# Patient Record
Sex: Female | Born: 1965 | Race: White | Hispanic: No | Marital: Married | Smoking: Current every day smoker
Health system: Southern US, Community
[De-identification: ages and names within clinical notes are randomized; demographics above are authoritative.]

## PROBLEM LIST (undated history)

## (undated) DIAGNOSIS — E039 Hypothyroidism, unspecified: Secondary | ICD-10-CM

## (undated) HISTORY — PX: DILATION AND CURETTAGE, DIAGNOSTIC / THERAPEUTIC: SUR384

## (undated) HISTORY — PX: MANDIBLE FRACTURE SURGERY: SHX706

## (undated) HISTORY — DX: Hypothyroidism, unspecified: E03.9

---

## 1998-12-18 ENCOUNTER — Other Ambulatory Visit: Admission: RE | Admit: 1998-12-18 | Discharge: 1998-12-18 | Payer: Self-pay | Admitting: *Deleted

## 1999-08-20 ENCOUNTER — Ambulatory Visit (HOSPITAL_COMMUNITY): Admission: RE | Admit: 1999-08-20 | Discharge: 1999-08-20 | Payer: Self-pay | Admitting: *Deleted

## 1999-08-20 ENCOUNTER — Encounter (INDEPENDENT_AMBULATORY_CARE_PROVIDER_SITE_OTHER): Payer: Self-pay | Admitting: Specialist

## 2000-04-22 ENCOUNTER — Other Ambulatory Visit: Admission: RE | Admit: 2000-04-22 | Discharge: 2000-04-22 | Payer: Self-pay | Admitting: Obstetrics and Gynecology

## 2001-05-10 ENCOUNTER — Other Ambulatory Visit: Admission: RE | Admit: 2001-05-10 | Discharge: 2001-05-10 | Payer: Self-pay | Admitting: Obstetrics and Gynecology

## 2001-05-13 ENCOUNTER — Ambulatory Visit (HOSPITAL_COMMUNITY): Admission: RE | Admit: 2001-05-13 | Discharge: 2001-05-13 | Payer: Self-pay | Admitting: Obstetrics and Gynecology

## 2001-05-13 ENCOUNTER — Encounter: Payer: Self-pay | Admitting: Obstetrics and Gynecology

## 2003-12-08 ENCOUNTER — Encounter: Admission: RE | Admit: 2003-12-08 | Discharge: 2003-12-08 | Payer: Self-pay | Admitting: Internal Medicine

## 2004-07-04 ENCOUNTER — Ambulatory Visit (HOSPITAL_COMMUNITY): Admission: RE | Admit: 2004-07-04 | Discharge: 2004-07-04 | Payer: Self-pay | Admitting: Internal Medicine

## 2004-08-07 ENCOUNTER — Encounter: Admission: RE | Admit: 2004-08-07 | Discharge: 2004-08-07 | Payer: Self-pay | Admitting: *Deleted

## 2005-07-08 IMAGING — CT CT HEAD W/O CM
1 series · 16 of 30 positions shown, 20 images · non-contrast
Comparison: none

CLINICAL DATA: Dizziness.

HEAD CT WITHOUT CONTRAST dated 07-04-2004
Normal-appearing cerebral hemispheres and posterior fossa structures. Normal size and position of
the ventricles. No intracranial hemorrhage or mass effect. Unremarkable bones and paranasal sinuses

[Series 2: brain · axial · 0.47mm/px · z∈[+139,+274]mm · 16 of 30 slices shown, 20 images]
[im 2/30  brain]
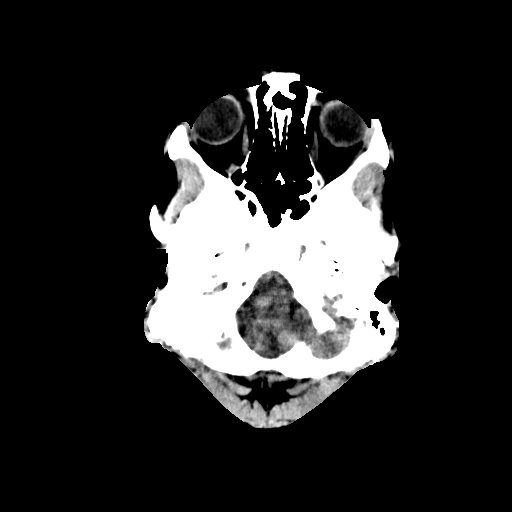
[im 2/30  bone]
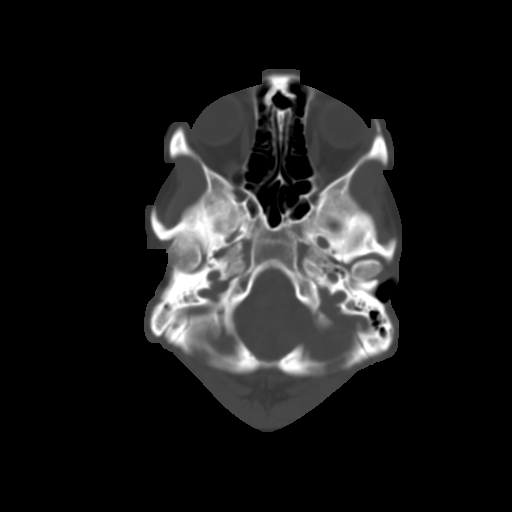
[im 4/30  brain]
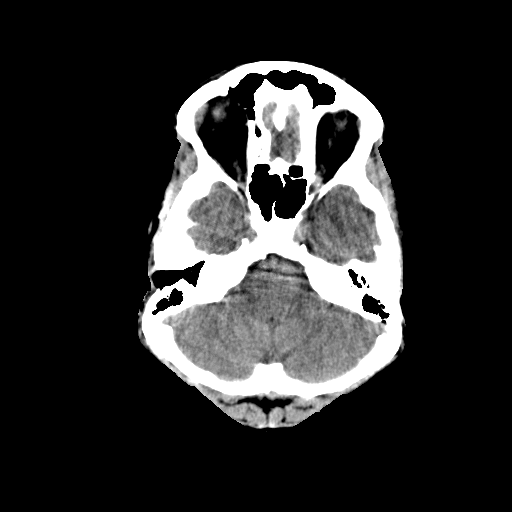
[im 6/30  brain]
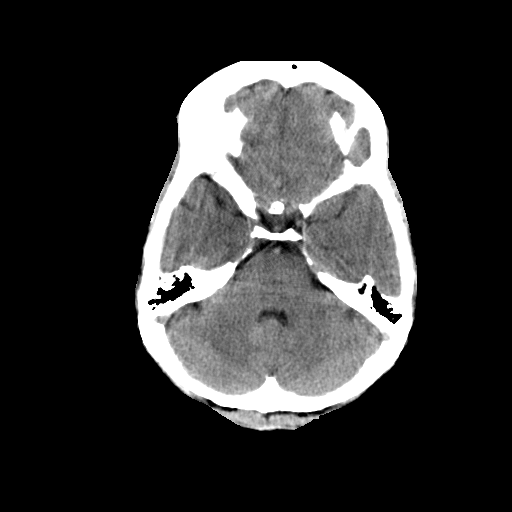
[im 8/30  brain]
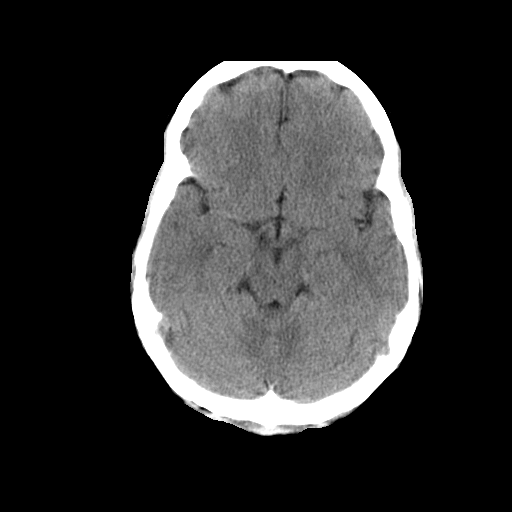
[im 9/30  brain]
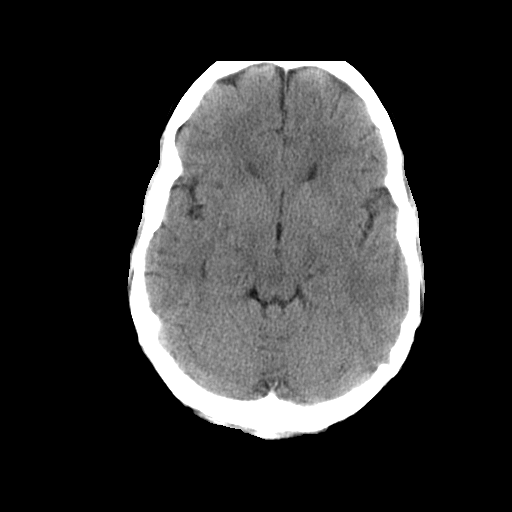
[im 9/30  bone]
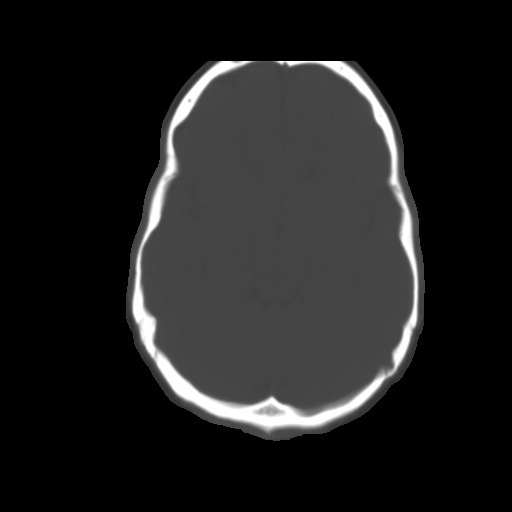
[im 11/30  brain]
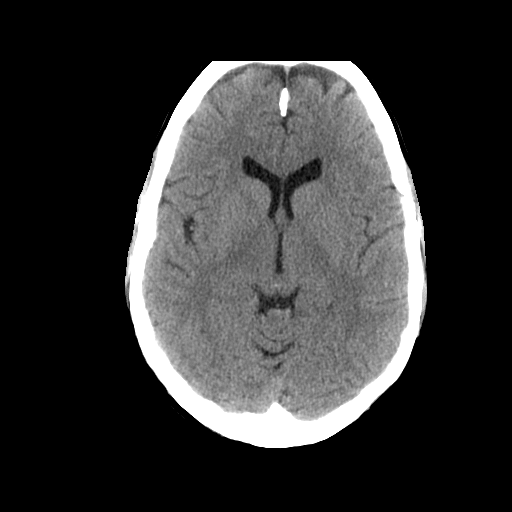
[im 13/30  brain]
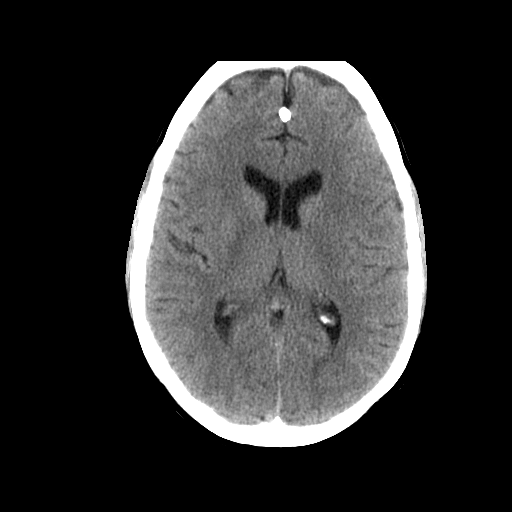
[im 15/30  brain]
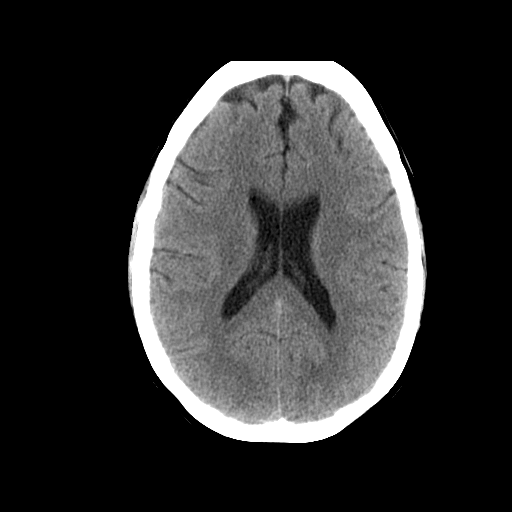
[im 16/30  brain]
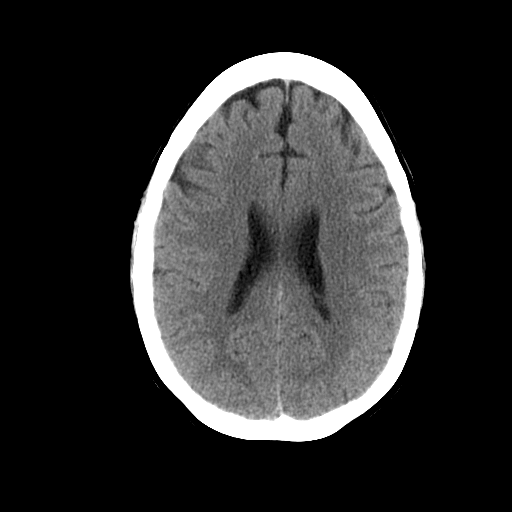
[im 16/30  bone]
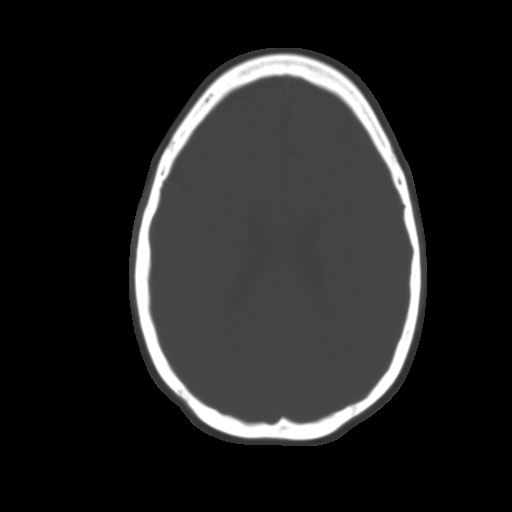
[im 18/30  brain]
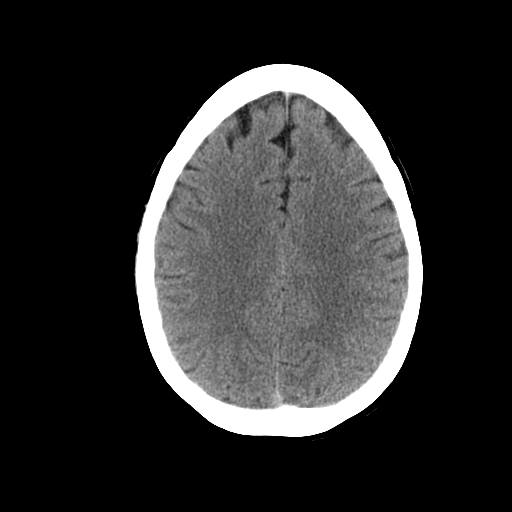
[im 20/30  brain]
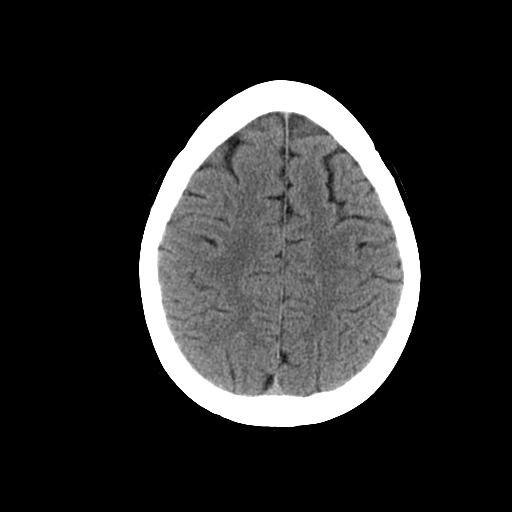
[im 22/30  brain]
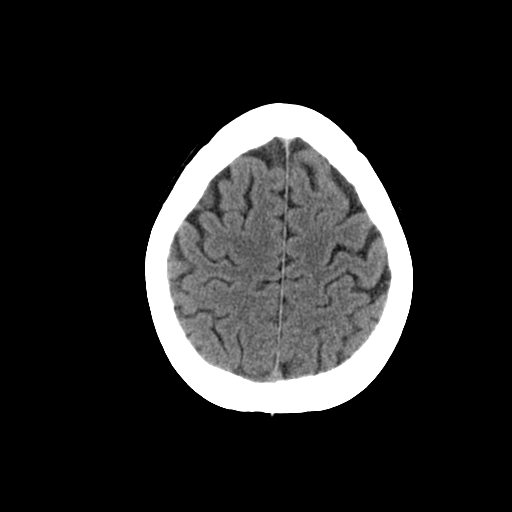
[im 23/30  brain]
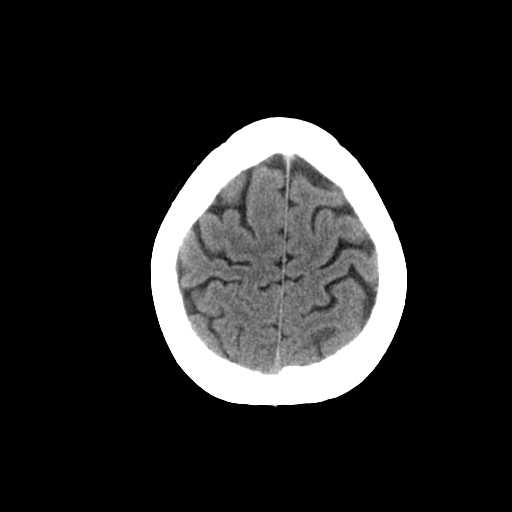
[im 23/30  bone]
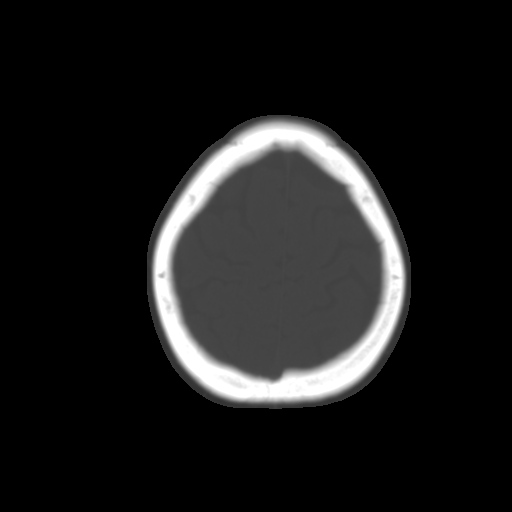
[im 25/30  brain]
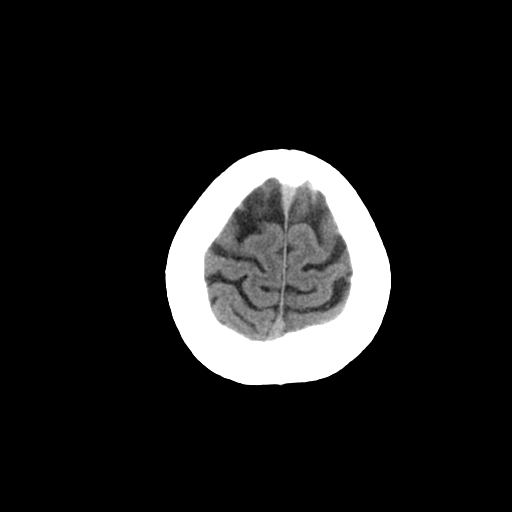
[im 27/30  brain]
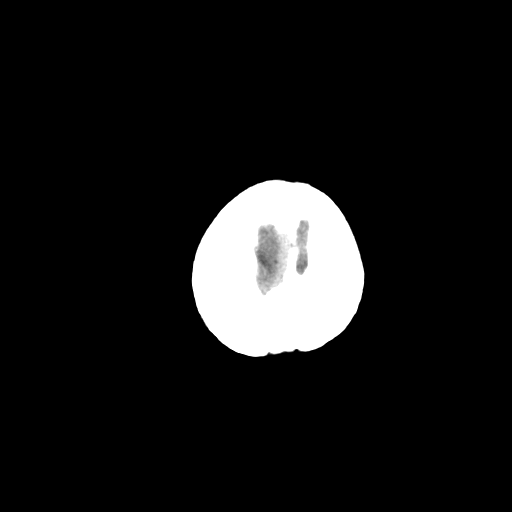
[im 29/30  brain]
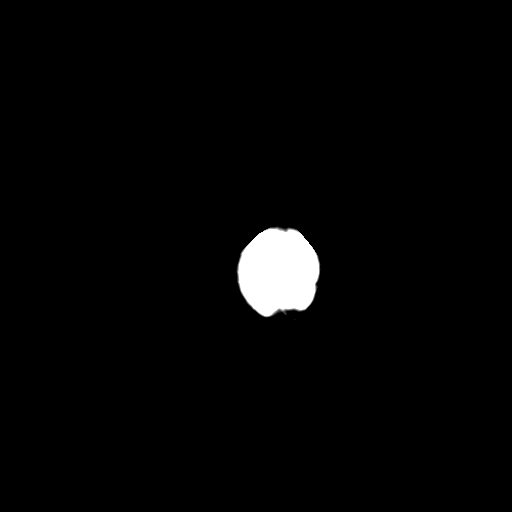

[16 of 30 positions shown; findings below may reference images not displayed]

IMPRESSION: Normal examination.

## 2009-11-30 ENCOUNTER — Ambulatory Visit: Payer: Self-pay | Admitting: Family Medicine

## 2009-11-30 DIAGNOSIS — E039 Hypothyroidism, unspecified: Secondary | ICD-10-CM | POA: Insufficient documentation

## 2010-01-22 ENCOUNTER — Ambulatory Visit: Payer: Self-pay | Admitting: Family Medicine

## 2010-01-29 ENCOUNTER — Encounter: Payer: Self-pay | Admitting: Family Medicine

## 2010-01-30 LAB — CONVERTED CEMR LAB
ALT: 15 units/L (ref 0–35)
AST: 14 units/L (ref 0–37)
Albumin: 4.6 g/dL (ref 3.5–5.2)
Alkaline Phosphatase: 53 units/L (ref 39–117)
BUN: 11 mg/dL (ref 6–23)
CO2: 22 meq/L (ref 19–32)
Calcium: 9.2 mg/dL (ref 8.4–10.5)
Chloride: 105 meq/L (ref 96–112)
Cholesterol: 172 mg/dL (ref 0–200)
Creatinine, Ser: 0.79 mg/dL (ref 0.40–1.20)
Glucose, Bld: 89 mg/dL (ref 70–99)
HDL: 60 mg/dL (ref >39)
LDL Cholesterol: 98 mg/dL (ref 0–99)
Potassium: 4.5 meq/L (ref 3.5–5.3)
Sodium: 139 meq/L (ref 135–145)
TSH: 3.74 microintl units/mL (ref 0.350–4.500)
Total Bilirubin: 0.6 mg/dL (ref 0.3–1.2)
Total CHOL/HDL Ratio: 2.9
Total Protein: 7 g/dL (ref 6.0–8.3)
Triglycerides: 72 mg/dL (ref <150)
VLDL: 14 mg/dL (ref 0–40)

## 2010-01-31 ENCOUNTER — Ambulatory Visit: Payer: Self-pay | Admitting: Family Medicine

## 2010-01-31 ENCOUNTER — Other Ambulatory Visit: Admission: RE | Admit: 2010-01-31 | Discharge: 2010-01-31 | Payer: Self-pay | Admitting: Family Medicine

## 2010-01-31 LAB — CONVERTED CEMR LAB: Pap Smear: NEGATIVE

## 2010-05-07 ENCOUNTER — Encounter: Payer: Self-pay | Admitting: Family Medicine

## 2010-05-08 LAB — CONVERTED CEMR LAB: TSH: 3.396 microintl units/mL (ref 0.350–4.500)

## 2010-07-12 ENCOUNTER — Encounter: Payer: Self-pay | Admitting: Family Medicine

## 2010-07-12 LAB — CONVERTED CEMR LAB: TSH: 0.928 microintl units/mL (ref 0.350–4.500)

## 2010-10-23 ENCOUNTER — Telehealth: Payer: Self-pay | Admitting: Family Medicine

## 2010-11-12 NOTE — Assessment & Plan Note (Signed)
Summary: FEVER & COUGH/KH   Vital Signs:  Patient Profile:   45 Years Old Female CC:      fever, runny nose, productive cough X 3 days Height:     64 inches Weight:      141 pounds O2 Sat:      100 % O2 treatment:    Room Air Temp:     99.4 degrees F oral Pulse rate:   119 / minute Pulse rhythm:   regular Resp:     18 per minute BP sitting:   132 / 87  (right arm) Cuff size:   regular  Pt. in pain?   no  Vitals Entered By: Lajean Saver, RN                   Prior Medication List:  No prior medications documented  Updated Prior Medication List: SYNTHROID 125 MCG TABS (LEVOTHYROXINE SODIUM) once daily VICKS DAYQUIL COUGH 15 MG/15ML LIQD (DEXTROMETHORPHAN HBR) prn  Current Allergies (reviewed today): ! * POISON OAKHistory of Present Illness Chief Complaint: fever, runny nose, productive cough X 3 days History of Present Illness: Patient has been sick since x4 days. Got worse 3 days ago w/fever. Today running nose,fever and cough and feels worse.  Current Problems: ACUTE SINUSITIS, UNSPECIFIED (ICD-461.9) HYPOTHYROIDISM (ICD-244.9)   Current Meds SYNTHROID 125 MCG TABS (LEVOTHYROXINE SODIUM) once daily VICKS DAYQUIL COUGH 15 MG/15ML LIQD (DEXTROMETHORPHAN HBR) prn AUGMENTIN 875-125 MG TABS (AMOXICILLIN-POT CLAVULANATE) 1 by mouth 2 times daily * NASONEX/OR FLONASE  NASAL SPRAY 2 puffs each nostril q day * CLARITIN-D 24HRS/OR ALLEGRA-D 24HRS 1 by mouth q dat  REVIEW OF SYSTEMS Constitutional Symptoms       Complains of fever and fatigue.     Denies chills, night sweats, weight loss, and weight gain.  Eyes       Denies change in vision, eye pain, eye discharge, glasses, contact lenses, and eye surgery. Ear/Nose/Throat/Mouth       Complains of frequent runny nose.      Denies hearing loss/aids, change in hearing, ear pain, ear discharge, dizziness, frequent nose bleeds, sinus problems, sore throat, hoarseness, and tooth pain or bleeding.  Respiratory  Complains of productive cough.      Denies dry cough, wheezing, shortness of breath, asthma, bronchitis, and emphysema/COPD.  Cardiovascular       Denies murmurs, chest pain, and tires easily with exhertion.    Gastrointestinal       Denies stomach pain, nausea/vomiting, diarrhea, constipation, blood in bowel movements, and indigestion. Genitourniary       Denies painful urination, kidney stones, and loss of urinary control. Neurological       Denies paralysis, seizures, and fainting/blackouts. Musculoskeletal       Denies muscle pain, joint pain, joint stiffness, decreased range of motion, redness, swelling, muscle weakness, and gout.  Skin       Denies bruising, unusual mles/lumps or sores, and hair/skin or nail changes.  Psych       Denies mood changes, temper/anger issues, anxiety/stress, speech problems, depression, and sleep problems. 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 Non Hodgkins lymphoma- father Family History of Stroke F 1st degree relative <60- mother Brain tumor- mother  Social History: Reviewed history and no changes required. 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 Current Smoker- 1ppd Alcohol use-no Drug use-no Smoking Status:  current Drug Use:  no Physical Exam General appearance: well developed, well nourished, no acute distress Head: normocephalic, atraumatic Ears: normal, no lesions or deformities Nasal: swollen red turbinates with  congestion Oral/Pharynx: tongue normal, posterior pharynx without erythema or exudate Neck: supple,anterior lymphadenopathy present Chest/Lungs: no rales, wheezes, or rhonchi bilateral, breath sounds equal without effort Heart: regular rate and  rhythm, no murmur Skin: no obvious rashes or lesions MSE: oriented to time, place, and person Assessment New Problems: ACUTE SINUSITIS, UNSPECIFIED (ICD-461.9) HYPOTHYROIDISM (ICD-244.9)  sinusitis  Patient Education: Patient and/or caregiver instructed in the following: rest fluids and Tylenol, quit smoking.  Plan New Medications/Changes: CLARITIN-D 24HRS/OR ALLEGRA-D 24HRS 1 by mouth q dat  #20 x 0, 11/30/2009, Hassan Rowan MD NASONEX/OR [place]  NASAL SPRAY 2 puffs each nostril q day  #1 x 0, 11/30/2009, Hassan Rowan MD AUGMENTIN [phone] MG TABS (AMOXICILLIN-POT CLAVULANATE) 1 by mouth 2 times daily  #20 x 0, 11/30/2009, Hassan Rowan MD  New Orders: New Patient Level III [phone] Follow Up: Follow up in 2-3 days if no improvement, Follow up with Primary Physician  The patient and/or caregiver has been counseled thoroughly with regard to medications prescribed including dosage, schedule, interactions, rationale for use, and possible side effects and they verbalize understanding.  Diagnoses and expected course of recovery discussed and will return if not improved as expected or if the condition worsens. Patient and/or caregiver verbalized understanding.  Prescriptions: CLARITIN-D 24HRS/OR ALLEGRA-D 24HRS 1 by mouth q dat  #20 x 0   Entered and Authorized by:   Hassan Rowan MD   Signed by:   Hassan Rowan MD on 11/30/2009   Method used:   Print then Give to Patient   RxID:   [id number] NASONEX/OR FLONASE  NASAL SPRAY 2 puffs each nostril q day  #1 x 0   Entered and Authorized by:   Hassan Rowan MD   Signed by:   Hassan Rowan MD on 11/30/2009   Method used:   Print then Give to Patient   RxID:   [id number] AUGMENTIN 875-125 MG TABS  (AMOXICILLIN-POT CLAVULANATE) 1 by mouth 2 times daily  #20 x 0   Entered and Authorized by:   Hassan Rowan MD   Signed by:   Hassan Rowan MD on 11/30/2009   Method used:   Print then Give to Patient   RxID:   [id number]   Patient Instructions: 1)  Please schedule a follow-up appointment as needed. 2)  Please schedule an appointment with your primary doctor in :5-7 days if not better 3)  Tobacco is very bad for your health and your loved ones! You Should stop smoking!. 4)  Stop Smoking Tips: Choose a Quit date. Cut down before the Quit date. decide what you will do as a substitute when you feel the urge to smoke(gum,toothpick,exercise). 5)  Take your antibiotic as prescribed until ALL of it is gone, but stop if you develop a rash or swelling and contact our office as soon as possible. 6)  Acute sinusitis symptoms for less than 10 days are not helped by antibiotics.Use warm moist compresses, and over the counter decongestants ( only as directed). Call if no improvement in 5-7 days, sooner if  increasing pain, fever, or new symptoms.

## 2010-11-12 NOTE — Assessment & Plan Note (Signed)
Summary: cpe- w/ pap   Vital Signs:  Patient profile:   45 year old female Height:      65.5 inches Weight:      138 pounds BMI:     22.70 O2 Sat:      100 % on Room air Pulse rate:   111 / minute BP sitting:   123 / 77  (left arm) Cuff size:   regular  Vitals Entered By: Payton Spark CMA (January 31, 2010 10:13 AM)  O2 Flow:  Room air CC: CPE w/ pap   Primary Care Provider:  Nani Gasser MD  CC:  CPE w/ pap.  History of Present Illness: Here for CPE. No specific complaints.  No hx of abmoral paps.   Current Medications (verified): 1)  Levothroid 137 Mcg Tabs (Levothyroxine Sodium) .... Take 1 Tablet By Mouth Once A Day  Allergies (verified): 1)  ! 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   Review of Systems  The patient denies anorexia, fever, weight loss, weight gain, vision loss, decreased hearing, hoarseness, chest pain, syncope, dyspnea on exertion, peripheral edema, prolonged cough, headaches, hemoptysis, abdominal pain, melena, hematochezia, severe indigestion/heartburn, hematuria, incontinence, genital sores, muscle weakness, suspicious skin lesions, transient blindness, difficulty walking, depression, unusual weight change, abnormal bleeding, enlarged lymph nodes, and breast masses.    Physical Exam  General:  Well-developed,well-nourished,in no acute distress; alert,appropriate and cooperative throughout examination Head:   Normocephalic and atraumatic without obvious abnormalities. No apparent alopecia or balding. Eyes:  No corneal or conjunctival inflammation noted. EOMI. Perrla.  Ears:  External ear exam shows no significant lesions or deformities.  Otoscopic examination reveals clear canals, tympanic membranes are intact bilaterally without bulging, retraction, inflammation or discharge. Hearing is grossly normal bilaterally. Nose:  External nasal examination shows no deformity or inflammation.  Mouth:  Oral mucosa and oropharynx without lesions or exudates.  Teeth in good repair. Neck:  No deformities, masses, or tenderness noted. Chest Wall:  No deformities, masses, or tenderness noted. Breasts:  No mass, nodules, thickening, tenderness, bulging, retraction, inflamation, nipple discharge or skin changes noted.   Lungs:  Normal respiratory effort, chest expands symmetrically. Lungs are clear to auscultation, no crackles or wheezes. Heart:  Normal rate and regular rhythm. S1 and S2 normal without gallop, murmur, click, rub or other extra sounds. Abdomen:  Bowel sounds positive,abdomen soft and non-tender without masses, organomegaly or hernias noted. Genitalia:  Normal introitus for age, no external lesions, no vaginal discharge, mucosa pink and moist, no vaginal or cervical lesions, no vaginal atrophy, no friaility or hemorrhage, normal uterus size and position, no adnexal masses or tenderness Msk:  No deformity or scoliosis noted of thoracic or lumbar spine.   Pulses:  R and L carotid,radial,dorsalis pedis and posterior tibial pulses are full and equal bilaterally Extremities:  No clubbing, cyanosis, edema, or deformity noted with normal full range of motion of all joints.   Neurologic:  No cranial nerve deficits noted. Station and gait are normal. DTRs are symmetrical throughout. Sensory, motor and coordinative  functions appear intact. Skin:  no rashes.   Cervical Nodes:  No lymphadenopathy noted Axillary  Nodes:  No palpable lymphadenopathy Psych:  Cognition and judgment appear intact. Alert and cooperative with normal attention span and concentration. No apparent delusions, illusions, hallucinations   Impression & Recommendations:  Problem # 1:  ROUTINE GYNECOLOGICAL EXAMINATION (ICD-V72.31) Dong well overall.  If pap is normal repeat in 2-3 years. Reviewed labs with her. Given copy. Encouraged smoking cessation, calcium intake and keep up the exercise.    Complete Medication List: 1)  Levothroid 137 Mcg Tabs (Levothyroxine sodium) .... Take 1 tablet by mouth once a day  Other Orders: T-TSH ([id number])  Patient Instructions: 1)  We will call you with your pap results.  2)  Remember to recheck your thyroid in 2-3 months.   3)  Tobacco is very bad for your health and your loved ones ! You should stop smoking !  4)  Take calcium +vitamin D daily.   Appended Document: cpe- w/ pap Call pt: Pap smear normal- Return for  Pap in 3 years April 29, 20118:35 AM Linford Arnold MD, Santina Evans  02/08/2010 @ 8:44am- Pt notified of results. KJ LPN

## 2010-11-12 NOTE — Assessment & Plan Note (Signed)
Summary: NOV: Thyroid   Vital Signs:  Patient profile:   45 year old female Height:      65.5 inches Weight:      140 pounds BMI:     23.03 Pulse rate:   85 / minute BP sitting:   116 / 71  (left arm) Cuff size:   regular  Vitals Entered By: Kathlene November (January 22, 2010 1:38 PM) CC: NP- get established Is Patient Diabetic? No   Primary Care Provider:  Nani Gasser MD  CC:  NP- get established.  History of Present Illness: No working the weekend options so planned on changing MD. Hx of hypothyroid around 2003. Current dose is stable. Last TSH check was in July 2010.  Was due for CPE and recheck thyroid in January. Denies any sx of thyroidlike fatigue, hair loss, etc. Helping take care of her mom who has alot of health problems.  Still having regular periods.   Habits & Providers  Alcohol-Tobacco-Diet     Alcohol drinks/day: <1     Tobacco Status: current     Cigarette Packs/Day: 1.0     Year Started: 15  Exercise-Depression-Behavior     Does Patient Exercise: yes     Type of exercise: aerobics, weights     Exercise (avg: min/session): <30     Have you felt down or hopeless? yes     STD Risk: never     Drug Use: never     Seat Belt Use: always  Current Medications (verified): 1)  Synthroid 125 Mcg Tabs (Levothyroxine Sodium) .... Once Daily  Allergies (verified): 1)  ! * Poison Oak  Comments:  Nurse/Medical Assistant: The patient's medications and allergies were reviewed with the patient and were updated in the Medication and Allergy Lists. 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 Packs/Day:  1.0 Does Patient Exercise:  yes STD Risk:  never Drug Use:  never Seat Belt Use:  always  Review of Systems       No fever/sweats/weakness, unexplained weight loss/gain.  No vison changes.  No difficulty hearing/ringing in ears, + hay fever/allergies.  No chest pain/discomfort, palpitations.  No Br lump/nipple discharge.  No cough/wheeze.  No blood in BM, nausea/vomiting/diarrhea.  No nighttime urination, leaking urine, unusual vaginal bleeding, discharge (penis or vagina).  No muscle/joint pain. No rash, change in mole.  No HA, memory loss.  No anxiety, sleep d/o, depression.  No easy bruising/bleeding, unexplained lump   Physical Exam  General:  Well-developed,well-nourished,in no acute distress; alert,appropriate and cooperative throughout examination Neck:  No deformities, masses, or tenderness noted. No TM.  Lungs:  Normal respiratory effort, chest expands symmetrically. Lungs are clear to auscultation, no crackles or wheezes. Heart:  Normal rate and regular rhythm. S1 and S2 normal without gallop, murmur, click, rub or other extra sounds. Pulses:  Radial 2+  Skin:  no rashes.   Cervical Nodes:  No lymphadenopathy noted Psych:  Cognition and judgment appear intact. Alert and cooperative with normal attention span and concentration. No apparent delusions, illusions, hallucinations  Impression & Recommendations:  Problem # 1:  HYPOTHYROIDISM (ICD-244.9) Due to recheck. She is asymptomatic. Rfeilled meds.  Encouraged her to schedule a CPE with pap as she is over due for her preventative care. She is also due for a mammogram to gave her info to call and schdule.  Her updated medication list for this problem includes:    Levothyroxine Sodium 125 Mcg Tabs (Levothyroxine sodium) .Marland Kitchen... Take 1 tablet by mouth once a day  Orders: T-TSH  ([id number])  Complete Medication List: 1)  Levothyroxine Sodium 125 Mcg Tabs (Levothyroxine sodium) .... Take 1 tablet by mouth once a day  Other Orders: T-Comprehensive Metabolic Panel [phone]) T-Lipid Profile ([id number])  Patient Instructions: 1)  Call [phone] to schedule your mammogram. Ask for the [facility] location. 2)  You can go to the labs anytime Mon-Fri 8AM-5PM.  Make sure fasting for 8 hours.   Prescriptions: LEVOTHYROXINE SODIUM 125 MCG TABS (LEVOTHYROXINE SODIUM) Take 1 tablet by mouth once a day  #30 x 1   Entered and Authorized by:   Nani Gasser MD   Signed by:   Nani Gasser MD on 01/22/2010   Method used:   Electronically to        [facility] (retail)       [address] [place], [place]  [address]       Ph: [phone]       Fax: [phone]   RxID:   [id number]   Flu Vaccine Result Date:  07/13/2009 Flu Vaccine Result:  given Flu Vaccine Next Due:  1 yr

## 2010-11-14 NOTE — Progress Notes (Signed)
Summary: FMLA  Phone Note Outgoing Call   Summary of Call: Will you call her and see if she wants [place] to go ahead adn fax the FMLA to [employer] and then she can pick up the hard copy? Initial call taken by: Nani Gasser MD,  October 23, 2010 4:07 PM  Follow-up for Phone Call        called and lakisha was not at home, cell num d/c Follow-up by: Avon Gully CMA, Duncan Dull),  October 24, 2010 8:33 AM  Additional Follow-up for Phone Call Additional follow up Details #1::        left message with caregiver for pt to callme back.  Additional Follow-up by: Avon Gully CMA, Duncan Dull),  October 29, 2010 11:23 AM

## 2011-02-04 ENCOUNTER — Encounter: Payer: Self-pay | Admitting: Family Medicine

## 2011-02-04 ENCOUNTER — Ambulatory Visit (INDEPENDENT_AMBULATORY_CARE_PROVIDER_SITE_OTHER): Payer: 59 | Admitting: Family Medicine

## 2011-02-04 DIAGNOSIS — E039 Hypothyroidism, unspecified: Secondary | ICD-10-CM

## 2011-02-04 DIAGNOSIS — Z13220 Encounter for screening for lipoid disorders: Secondary | ICD-10-CM

## 2011-02-04 LAB — LIPID PANEL
LDL Cholesterol: 122 mg/dL — ABNORMAL HIGH (ref 0–99)
VLDL: 11 mg/dL (ref 0–40)

## 2011-02-04 NOTE — Assessment & Plan Note (Signed)
Doing well. Asymptomatic.  Will send ove rnew rx after get the labs back.  Going for labs today.

## 2011-02-04 NOTE — Progress Notes (Signed)
  Subjective:    Patient ID: Leslie Hill, female    DOB: [birth year], 45 y.o.   MRN: [id number]  Thyroid Problem Presents for follow-up visit. Symptoms include weight gain. Patient reports no cold intolerance, constipation, dry skin, fatigue, nail problem, palpitations or weight loss. The symptoms have been stable.  She is sleeping well.  Here to f/u her thyroid. Last check was in September.     Review of Systems  Constitutional: Positive for weight gain. Negative for weight loss and fatigue.  Cardiovascular: Negative for palpitations.  Gastrointestinal: Negative for constipation.  Hematological: Negative for cold intolerance.       Objective:   Physical Exam  Constitutional: She appears well-developed and well-nourished.  Neck: Neck supple. No thyromegaly present.  Cardiovascular: Normal rate, regular rhythm and normal heart sounds.   Pulmonary/Chest: Effort normal and breath sounds normal.  Lymphadenopathy:    She has no cervical adenopathy.  Skin: Skin is warm and dry.          Assessment & Plan:

## 2011-02-05 ENCOUNTER — Telehealth: Payer: Self-pay | Admitting: Family Medicine

## 2011-02-05 LAB — COMPLETE METABOLIC PANEL WITH GFR
ALT: 19 U/L (ref 0–35)
AST: 17 U/L (ref 0–37)
Albumin: 5.2 g/dL (ref 3.5–5.2)
BUN: 11 mg/dL (ref 6–23)
CO2: 21 mEq/L (ref 19–32)
Calcium: 10 mg/dL (ref 8.4–10.5)
Chloride: 104 mEq/L (ref 96–112)
GFR, Est African American: >60 mL/min (ref >60)
Potassium: 4.2 mEq/L (ref 3.5–5.3)

## 2011-02-05 NOTE — Telephone Encounter (Signed)
Pt notified; pt was not fasting at the time

## 2011-02-05 NOTE — Telephone Encounter (Signed)
Call pt: thyroid and cMP are normal. LDL cholesterol is elevated a little. Make sure eating healthy and getting regular exercise and recheck in 1 year.

## 2011-02-14 ENCOUNTER — Other Ambulatory Visit: Payer: Self-pay | Admitting: *Deleted

## 2011-02-14 MED ORDER — LEVOTHYROXINE SODIUM 150 MCG PO TABS: 150.0000 ug | ORAL_TABLET | Freq: Every day | ORAL | Status: DC

## 2011-02-14 NOTE — Telephone Encounter (Signed)
Pt called and said she called [facility] yesterday and a fax was sent there and [facility] was going to send that on to [place] on behalf of the patient so we could auth refill.   Plan:  [facility] for pt to call with the name of medication to be refilled so could be expedited. Jarvis Newcomer, LPN Domingo Dimes

## 2011-02-14 NOTE — Telephone Encounter (Signed)
LMOM for patient letting her know that her levothyroxine medication had been approved and to call her pharmacy to make sure they have it ready. Jarvis Newcomer, LPN Domingo Dimes

## 2011-02-28 NOTE — Op Note (Signed)
[facility]  Patient:    Leslie Hill                     MRN: [id number] Proc. Date: 08/20/99 Adm. Date:  [id number] Attending:  Donne Hazel                           Operative Report  PREOPERATIVE DIAGNOSIS:       Incomplete abortion.  POSTOPERATIVE DIAGNOSIS:      Incomplete abortion.  OPERATION:                    D&E.  SURGEON:                      R. Alan Mulder, M.D.  ASSISTANT:  ANESTHESIA:                   MAC with local supplementation.  ESTIMATED BLOOD LOSS:         Less than 50 cc.  COMPLICATIONS:                None.  FINDINGS:                     Products of conception.  DESCRIPTION OF PROCEDURE:     The patient was taken to the operating room where a MAC anesthetic was administered.  The patient was placed on the operating table in the dorsal lithotomy position, the perineum and vagina were prepped and draped n the usual sterile fashion with Betadine and sterile drapes.  A speculum was placed. The anterior  lip of the cervix was grasped with a single tooth tenaculum, the cervix was then infiltrated for a paracervical block in the 3 and 9 oclock positions.  About 10 cc of 1% Xylocaine was used to infiltrate this area. Next, the cervix was then serially dilated until a #21 Pratt dilator fit easily into he intracervical os.  Three passes were made with the suction curet, using a #7 curved suction curet, the uterus was emptied systematically in the usual fashion x 3.  Next, the uterus was sharply curetted and noted to be empty.  Two final passes ere then made with the suction curet.  All vaginal instruments were then removed. he patient was awakened and taken to the recovery room in good condition.  There were no perioperative complications.  Estimated blood loss was less than 50 cc. Blood type was O positive. DD:  08/20/99 TD:  08/21/99 Job: [id number]

## 2011-05-23 ENCOUNTER — Other Ambulatory Visit: Payer: Self-pay | Admitting: *Deleted

## 2011-05-23 MED ORDER — LEVOTHYROXINE SODIUM 150 MCG PO TABS: 150.0000 ug | ORAL_TABLET | Freq: Every day | ORAL | Status: DC

## 2012-01-29 ENCOUNTER — Encounter: Payer: Self-pay | Admitting: Family Medicine

## 2012-01-29 ENCOUNTER — Ambulatory Visit (INDEPENDENT_AMBULATORY_CARE_PROVIDER_SITE_OTHER): Payer: 59 | Admitting: Family Medicine

## 2012-01-29 VITALS — BP 137/87 | HR 101 | Ht 66.0 in | Wt 159.0 lb

## 2012-01-29 DIAGNOSIS — N926 Irregular menstruation, unspecified: Secondary | ICD-10-CM

## 2012-01-29 DIAGNOSIS — E039 Hypothyroidism, unspecified: Secondary | ICD-10-CM

## 2012-01-29 DIAGNOSIS — E785 Hyperlipidemia, unspecified: Secondary | ICD-10-CM

## 2012-01-29 DIAGNOSIS — R635 Abnormal weight gain: Secondary | ICD-10-CM

## 2012-01-29 DIAGNOSIS — N920 Excessive and frequent menstruation with regular cycle: Secondary | ICD-10-CM

## 2012-01-29 NOTE — Progress Notes (Signed)
Subjective:    Patient ID: Leslie Hill, female    DOB: [birth year], 46 y.o.   MRN: [id number]  HPI Period are more irregular. Sometimes 3-8 days long. Somes times late and sometimes 2-3 days early.  Sometimes light and sometimes very heavy.  She would like to check her hormone levels. She feels her diet is the same but has gained a lot of weight.  She did loose her father recently which has been  And has been greiving. Says her activity level has dropped as well.  She is still working full time.  Hx of hypothyroid.  Last check was a year ago.  She did do some grief counseling for about 3 months after her father passed away. She feels it was helpful.   Review of Systems BP 137/87  Pulse 101  Ht 5\' 6"  (1.676 m)  Wt 159 lb (72.122 kg)  BMI 25.66 kg/m2    No Known Allergies  Past Medical History  Diagnosis Date  . Hypothyroidism     Past Surgical History  Procedure Date  . Mandible fracture surgery   . Dilation and curettage, diagnostic / therapeutic     History   Social History  . Marital Status: Married    Spouse Name: N/A    Number of Children: N/A  . Years of Education: N/A   Occupational History  . Not on file.   Social History Main Topics  . Smoking status: Not on file  . Smokeless tobacco: Not on file  . Alcohol Use: Not on file  . Drug Use: Not on file  . Sexually Active: Not on file   Other Topics Concern  . Not on file   Social History Narrative  . No narrative on file    Family History  Problem Relation Age of Onset  . Lymphoma Father     nonhodgkins  . Stroke    . Hypertension Mother   . Hyperlipidemia Mother     Outpatient Encounter Prescriptions as of 01/29/2012  Medication Sig Dispense Refill  . levothyroxine (SYNTHROID) 150 MCG tablet Take 1 tablet (150 mcg total) by mouth daily.  30 tablet  2  . psyllium (METAMUCIL) 58.6 % powder Take 1 packet by mouth daily.                Objective:   Physical Exam  Constitutional: She is  oriented to person, place, and time. She appears well-developed and well-nourished.  HENT:  Head: Normocephalic and atraumatic.  Neck: Neck supple. No thyromegaly present.  Cardiovascular: Normal rate, regular rhythm and normal heart sounds.   Pulmonary/Chest: Effort normal and breath sounds normal.  Lymphadenopathy:    She has no cervical adenopathy.  Neurological: She is alert and oriented to person, place, and time.  Skin: Skin is warm and dry.  Psychiatric: She has a normal mood and affect. Her behavior is normal.          Assessment & Plan:  Irregular periods - Will check hormone levels but suspect she is perio-menopausal.  We will check her hormone levels today but I let her know that they could come back normal. Regarding perimenopausal symptoms hormones are up and down. Also her thyroid is not well controlled that could affect her periods as well. I also encouraged her get back into a routine of regular exercise because that'll help regulate her periods as well. Because she's had a very heavy periods would also like to check a CBC to make sure she is  not anemic. Consider referral to OB/GYN for dysfunctional uterine bleeding.  Weight gain -  Dicussed potential causes.  We discussed getting back into regular exercise routine. It sounds like her diet is actually very good. I see no major concerns there. We will check her thyroid to make sure she is well controlled. Her last check was almost a year ago. We also discussed could consider phentermine in addition to a regular exercise program. We discussed how the metabolism does seem to slow down when one enters perimenopause.  Hyperlipidemia-due to recheck her cholesterol. She is not fasting today but did give her a lab slip to go later.  Not sure when last tetanus but will check with her employee health.

## 2012-01-29 NOTE — Patient Instructions (Signed)
We will call you with your lab results. If you don't here from us in about a week then please give us a call at [phone].  

## 2012-01-30 ENCOUNTER — Encounter: Payer: Self-pay | Admitting: *Deleted

## 2012-01-30 LAB — COMPLETE METABOLIC PANEL WITH GFR
Albumin: 4.6 g/dL (ref 3.5–5.2)
BUN: 11 mg/dL (ref 6–23)
CO2: 28 mEq/L (ref 19–32)
Calcium: 9.4 mg/dL (ref 8.4–10.5)
Chloride: 104 mEq/L (ref 96–112)
GFR, Est African American: >89 mL/min
GFR, Est Non African American: >89 mL/min
Glucose, Bld: 88 mg/dL (ref 70–99)
Potassium: 4.7 mEq/L (ref 3.5–5.3)

## 2012-01-30 LAB — CBC
Platelets: 229 10*3/uL (ref 150–400)
RBC: 4.14 MIL/uL (ref 3.87–5.11)
WBC: 5.7 10*3/uL (ref 4.0–10.5)

## 2012-01-30 LAB — FOLLICLE STIMULATING HORMONE: FSH: 19.5 m[IU]/mL

## 2012-06-19 ENCOUNTER — Encounter: Payer: Self-pay | Admitting: *Deleted

## 2012-06-19 ENCOUNTER — Emergency Department
Admission: EM | Admit: 2012-06-19 | Discharge: 2012-06-19 | Disposition: A | Discharge status: Home / Self Care | Payer: 59 | Source: Home / Self Care | Attending: Emergency Medicine | Admitting: Emergency Medicine

## 2012-06-19 DIAGNOSIS — L259 Unspecified contact dermatitis, unspecified cause: Secondary | ICD-10-CM

## 2012-06-19 MED ORDER — PREDNISONE (PAK) 10 MG PO TABS: ORAL_TABLET | ORAL | Status: DC

## 2012-06-19 MED ORDER — PREDNISONE (PAK) 10 MG PO TABS: ORAL_TABLET | ORAL | Status: AC

## 2012-06-19 NOTE — ED Notes (Signed)
Pt developed a rash 2 days ago.  Started on both thighs and is now all over her body except her face.  Denies itching and pain.  Denies coming in contact with anything new

## 2012-06-19 NOTE — ED Provider Notes (Signed)
History     CSN: [id number]  Arrival date & time 06/19/12  1508   First MD Initiated Contact with Patient 06/19/12 1517      Chief Complaint  Patient presents with  . Rash   Patient is a 46 y.o. female presenting with rash. The history is provided by the patient.  Rash  This is a new problem. The current episode started 2 days ago. The problem has been gradually worsening. Associated with: Unsure. Patient worked in her yard the day before the rash started. Also, she wore new scrubs at work that day the rash started. The rash seems to be located only where there was contact with the scrubs, and the face neck hands and feet are spared. There has been no fever. The rash is present on the back, abdomen, trunk, left upper leg, right upper leg, right arm and left arm. The pain is at a severity of 0/10. She has tried nothing for the symptoms. Risk factors: Denies any new medications. See also above.  ----  The rash started 2 days ago on the anterior thighs, but has progressed to remainder of legs, trunk, and both upper arms. There is minimal itch. No pustules or drainage or blisters noted. She denies any exposure to patients with infectious diseases. She otherwise feels well and denies fever or cold symptoms or cardiorespiratory symptoms.  Past Medical History  Diagnosis Date  . Hypothyroidism     Past Surgical History  Procedure Date  . Mandible fracture surgery   . Dilation and curettage, diagnostic / therapeutic     Family History  Problem Relation Age of Onset  . Lymphoma Father     nonhodgkins  . Stroke    . Hypertension Mother   . Hyperlipidemia Mother     History  Substance Use Topics  . Smoking status: Current Everyday Smoker  . Smokeless tobacco: Not on file  . Alcohol Use: Yes    OB History    Grav Para Term Preterm Abortions TAB SAB Ect Mult Living                  Review of Systems  Constitutional: Negative for fever, chills and fatigue.  HENT: Negative.   Negative for facial swelling.   Eyes: Negative.  Negative for itching.  Respiratory: Negative.  Negative for cough.   Cardiovascular: Negative.  Negative for chest pain.  Gastrointestinal: Negative.   Genitourinary: Negative.   Musculoskeletal: Negative.  Negative for joint swelling and arthralgias.  Skin: Positive for rash. Negative for wound.  Neurological: Negative.   Hematological: Negative.   Psychiatric/Behavioral: Negative.   All other systems reviewed and are negative.    Allergies  Review of patient's allergies indicates no known allergies.  Home Medications   Current Outpatient Rx  Name Route Sig Dispense Refill  . LEVOTHYROXINE SODIUM 150 MCG PO TABS Oral Take 1 tablet (150 mcg total) by mouth daily. 30 tablet 2  . PREDNISONE (PAK) 10 MG PO TABS  Take as directed for 6 days. 21 tablet 0  . PSYLLIUM 58.6 % PO POWD Oral Take 1 packet by mouth daily.        BP 135/85  Pulse 98  Temp 98.7 F (37.1 C) (Oral)  Resp 16  Ht 5\' 4"  (1.626 m)  Wt 160 lb 12 oz (72.916 kg)  BMI 27.59 kg/m2  SpO2 99%  LMP 06/13/2012  Physical Exam  Nursing note and vitals reviewed. Constitutional: She is oriented to person, place, and time.  She appears well-developed and well-nourished. No distress.  HENT:  Head: Normocephalic and atraumatic.  Eyes: Conjunctivae and EOM are normal. Pupils are equal, round, and reactive to light. No scleral icterus.  Neck: Normal range of motion. Neck supple.  Cardiovascular: Normal rate and normal heart sounds.   No murmur heard. Pulmonary/Chest: Effort normal and breath sounds normal.  Abdominal: Soft. She exhibits no distension. There is no tenderness.  Musculoskeletal: Normal range of motion. She exhibits no edema and no tenderness.  Lymphadenopathy:    She has no cervical adenopathy.  Neurological: She is alert and oriented to person, place, and time.  Skin: Skin is warm. Rash noted. Rash is papular (discrete papular red rash, diffusely on  thighs, trunk, upper arms. The face, neck, hands and feet are spared. No pustules or vesicles. No red streaks. No fluctuance.).  Psychiatric: She has a normal mood and affect.    ED Course  Procedures (including critical care time)  Labs Reviewed - No data to display No results found.   1. Contact dermatitis       MDM  Likely has some type of contact dermatitis, either plant exposure, or from the scrubs she was wearing. This is not shingles, as the rash crosses the midline. No evidence of bacterial or infectious cause. No evidence for viral cause as she otherwise feels well and her exam is otherwise normal. We discussed treatment options, and after risks, benefits, alternatives discussed, the following was done: rx prednisone 10 mg-6 day Dosepak. OTC Zyrtec when necessary itch. Other modalities such as Aveeno bath or compresses. Followup here or with PCP when necessary. Red flags discussed. She voiced understanding and agreement with above plans.        Lajean Manes, MD 06/19/12 [id number]

## 2012-09-01 ENCOUNTER — Other Ambulatory Visit: Payer: Self-pay | Admitting: Family Medicine

## 2013-02-28 ENCOUNTER — Other Ambulatory Visit: Payer: Self-pay | Admitting: *Deleted

## 2013-02-28 MED ORDER — LEVOTHYROXINE SODIUM 150 MCG PO TABS: ORAL_TABLET | ORAL | Status: DC

## 2013-04-11 ENCOUNTER — Telehealth: Payer: Self-pay | Admitting: *Deleted

## 2013-04-11 DIAGNOSIS — E039 Hypothyroidism, unspecified: Secondary | ICD-10-CM

## 2013-04-11 LAB — TSH: TSH: 2.322 u[IU]/mL (ref 0.350–4.500)

## 2013-04-11 NOTE — Telephone Encounter (Signed)
tsh lab ordered & faxed downstairs.

## 2013-04-14 ENCOUNTER — Ambulatory Visit (INDEPENDENT_AMBULATORY_CARE_PROVIDER_SITE_OTHER): Payer: 59 | Admitting: Family Medicine

## 2013-04-14 ENCOUNTER — Encounter: Payer: Self-pay | Admitting: Family Medicine

## 2013-04-14 VITALS — BP 146/86 | HR 102 | Ht 64.0 in | Wt 167.0 lb

## 2013-04-14 DIAGNOSIS — E039 Hypothyroidism, unspecified: Secondary | ICD-10-CM

## 2013-04-14 DIAGNOSIS — N951 Menopausal and female climacteric states: Secondary | ICD-10-CM

## 2013-04-14 DIAGNOSIS — Z720 Tobacco use: Secondary | ICD-10-CM

## 2013-04-14 DIAGNOSIS — R635 Abnormal weight gain: Secondary | ICD-10-CM

## 2013-04-14 DIAGNOSIS — Z1231 Encounter for screening mammogram for malignant neoplasm of breast: Secondary | ICD-10-CM

## 2013-04-14 DIAGNOSIS — F17200 Nicotine dependence, unspecified, uncomplicated: Secondary | ICD-10-CM

## 2013-04-14 DIAGNOSIS — Z23 Encounter for immunization: Secondary | ICD-10-CM

## 2013-04-14 DIAGNOSIS — J019 Acute sinusitis, unspecified: Secondary | ICD-10-CM

## 2013-04-14 NOTE — Patient Instructions (Addendum)
Can investigate Phentermine, Qsymia and Belviq for weight loss.  Also consider nutrition referral and continue exercising.  If your sinus symptoms are not better by Monday then please give me a call and I will send in an antibiotic to your pharmacy.

## 2013-04-14 NOTE — Progress Notes (Signed)
  Subjective:    Patient ID: Leslie Hill, female    DOB: 1966/01/25, 47 y.o.   MRN: [id number]  HPI Hypothyroid - No skin changes. Has noticed some hair loss.  No weight changes. Periods have been more irregular. Having some hotflashes.   Sinus sxs x 1 week.  Small amt yellow d/c.  Using DayQuil and NyQuil.no fever, chills.  Mild ST first day.  No ear pain.  + cough too.  Mild nasal congestion.  No SOB.    Menopausal symptoms-she still really struggling with her menopausal symptoms. She's having lots of hot flashes. She denies any significant problems with mood. Sleep quality is poor on and off. She is not interested in hormone replacement therapy but did want to discuss options. We did her blood work last year looked like she was l perimenopausal and her periods have become less frequent and more erratic. Review of Systems     Objective:   Physical Exam  Constitutional: She is oriented to person, place, and time. She appears well-developed and well-nourished.  HENT:  Head: Normocephalic and atraumatic.  Neck: Neck supple. No thyromegaly present.  Cardiovascular: Normal rate, regular rhythm and normal heart sounds.   Pulmonary/Chest: Effort normal and breath sounds normal.  Lymphadenopathy:    She has no cervical adenopathy.  Neurological: She is alert and oriented to person, place, and time.  Skin: Skin is warm and dry.  Psychiatric: She has a normal mood and affect. Her behavior is normal.          Assessment & Plan:  Hypothyroid - Tsh looked great. F/U in 1 year. Ok fo rRF.   Acute sinusitis- feels a little better today. Continue symptomatic care. Call if not better by Monday.   Abnormal weight gain-she says she's really try very hard to lose weight. She then got a treadmill and was working out daily and was unable to lose a single pound. She says overall she feels she eats. Healthy and doesn't snack. She says she only drinks water. She can't understand why she can't lose  weight especially with a normal thyroid level. I offered to refer her to the nutritionist. I think this could be very helpful information with her exercise routine. She really feels overall that she does do well with her diet and declined today. We could consider weight loss meds but these would be very temporary. She asked that I write these down for her so she could look them up.  Discussed current meds on the market and risks and benefits.   Tobacco abuse-encourage cessation.  Needs Tdap and mammogram  Menopausal sxs - Dsicussed tx options.  We discussed the potential risks and benefits of hormone replacement therapy. She is a smoker so this would put her at increased risk of a blood clot. In addition to heart disease and breast cancer risk. She has never had a mammogram encouraged her to do so. We also discussed that there are some non-normal options such as Effexor if her symptoms become difficult to live with. At this point time she is not interested in any type of medical therapy.

## 2013-04-17 ENCOUNTER — Encounter: Payer: Self-pay | Admitting: Family Medicine

## 2013-04-17 DIAGNOSIS — Z720 Tobacco use: Secondary | ICD-10-CM | POA: Insufficient documentation

## 2013-04-17 DIAGNOSIS — N951 Menopausal and female climacteric states: Secondary | ICD-10-CM | POA: Insufficient documentation

## 2013-04-18 ENCOUNTER — Telehealth: Payer: Self-pay | Admitting: *Deleted

## 2013-04-18 NOTE — Telephone Encounter (Signed)
Pt states got the Tdap injection last Thursday and arm was still really sore and had diarrhea all day Friday and she didn't know if that came from the shot or just coincidence.  I informed her that the tdap can make athe arm sore and if no redness, rash, fever, swelling then she should be ok but that I would let you know about the diarrhea to see if common side effect or if this was some type of allergic reaction. Barry Dienes, LPN

## 2013-04-18 NOTE — Telephone Encounter (Signed)
No diarrhea is coincidental.

## 2013-04-18 NOTE — Telephone Encounter (Signed)
Pt.notified

## 2013-10-04 ENCOUNTER — Other Ambulatory Visit: Payer: Self-pay | Admitting: Family Medicine

## 2013-10-10 ENCOUNTER — Telehealth: Payer: Self-pay | Admitting: *Deleted

## 2013-10-10 NOTE — Telephone Encounter (Signed)
Pt would like copy of Tdap report. She stated that she spoke w/Ms.Mayford Knife and that she was to fax this for her.Loralee Pacas Carl

## 2013-10-31 ENCOUNTER — Other Ambulatory Visit: Payer: Self-pay | Admitting: Family Medicine

## 2014-03-20 ENCOUNTER — Other Ambulatory Visit: Payer: Self-pay | Admitting: Family Medicine

## 2014-04-23 ENCOUNTER — Other Ambulatory Visit: Payer: Self-pay | Admitting: Family Medicine

## 2014-04-24 NOTE — Telephone Encounter (Signed)
Needs f/u before future refills
